# Patient Record
Sex: Male | Born: 1945 | Race: White | Hispanic: No | Marital: Married
Health system: Southern US, Community
[De-identification: ages and names within clinical notes are randomized; demographics above are authoritative.]

---

## 2013-10-24 ENCOUNTER — Other Ambulatory Visit: Payer: Self-pay | Admitting: Nurse Practitioner

## 2013-10-24 DIAGNOSIS — Z8249 Family history of ischemic heart disease and other diseases of the circulatory system: Secondary | ICD-10-CM

## 2013-10-24 DIAGNOSIS — R1084 Generalized abdominal pain: Secondary | ICD-10-CM

## 2013-10-24 DIAGNOSIS — R109 Unspecified abdominal pain: Secondary | ICD-10-CM

## 2013-11-04 ENCOUNTER — Ambulatory Visit
Admission: RE | Admit: 2013-11-04 | Discharge: 2013-11-04 | Disposition: A | Discharge status: Home / Self Care | Payer: Medicare Other | Source: Ambulatory Visit | Attending: Nurse Practitioner | Admitting: Nurse Practitioner

## 2013-11-04 DIAGNOSIS — Z8249 Family history of ischemic heart disease and other diseases of the circulatory system: Secondary | ICD-10-CM

## 2013-11-04 DIAGNOSIS — R1084 Generalized abdominal pain: Secondary | ICD-10-CM

## 2016-05-16 IMAGING — US US AORTA
1 series · 9 of 9 positions shown · non-contrast
Comparison: None.

CLINICAL DATA: 68-year-old male, with abdominal pain and a family
history of aortic aneurysm.

EXAM:
ULTRASOUND OF ABDOMINAL AORTA
TECHNIQUE: Ultrasound examination of the abdominal aorta was performed to
evaluate for abdominal aortic aneurysm.

[Series 1: us aorta · 0.27mm/px · 9 of 9 slices shown]
[im 1/9]
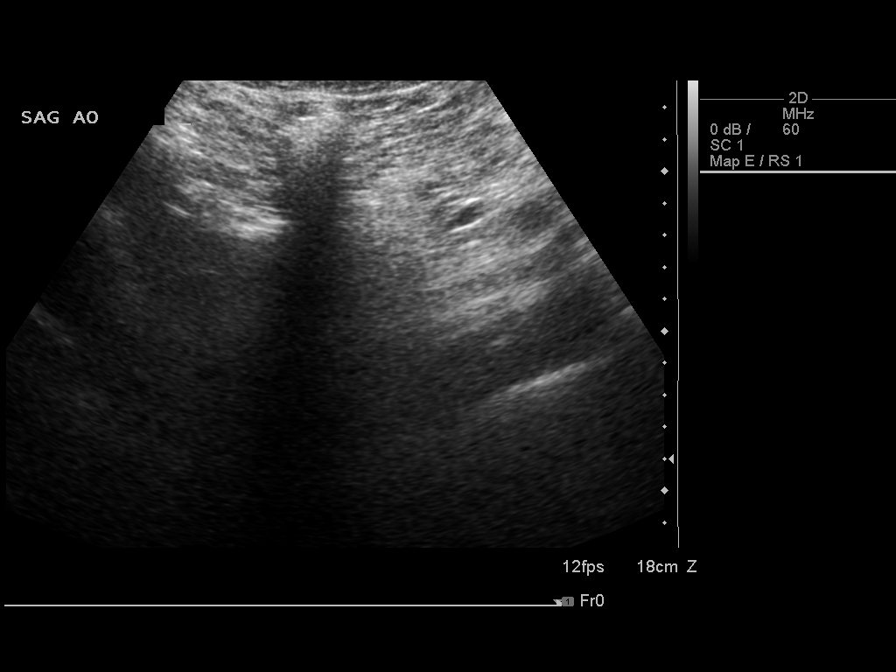
[im 2/9]
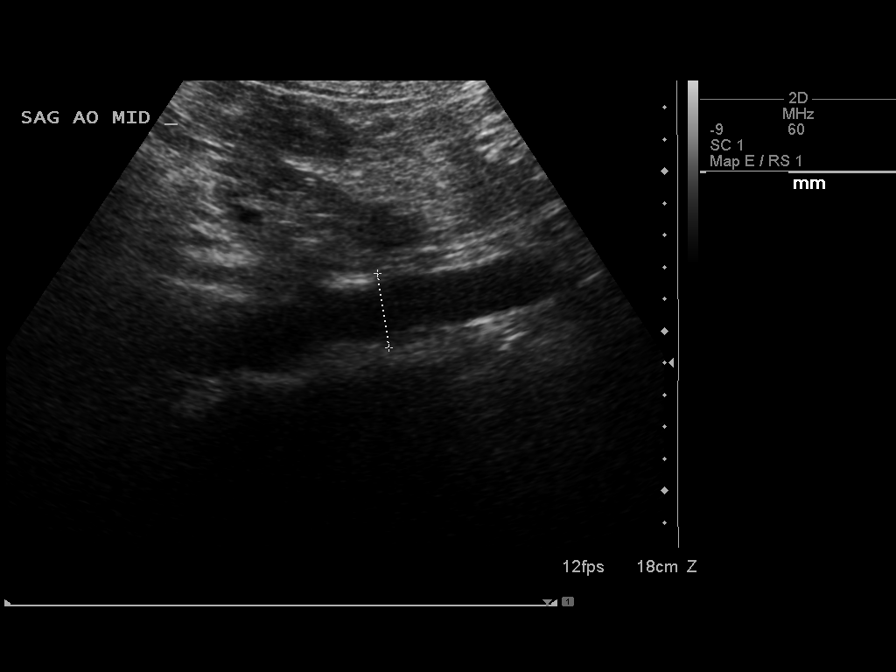
[im 3/9]
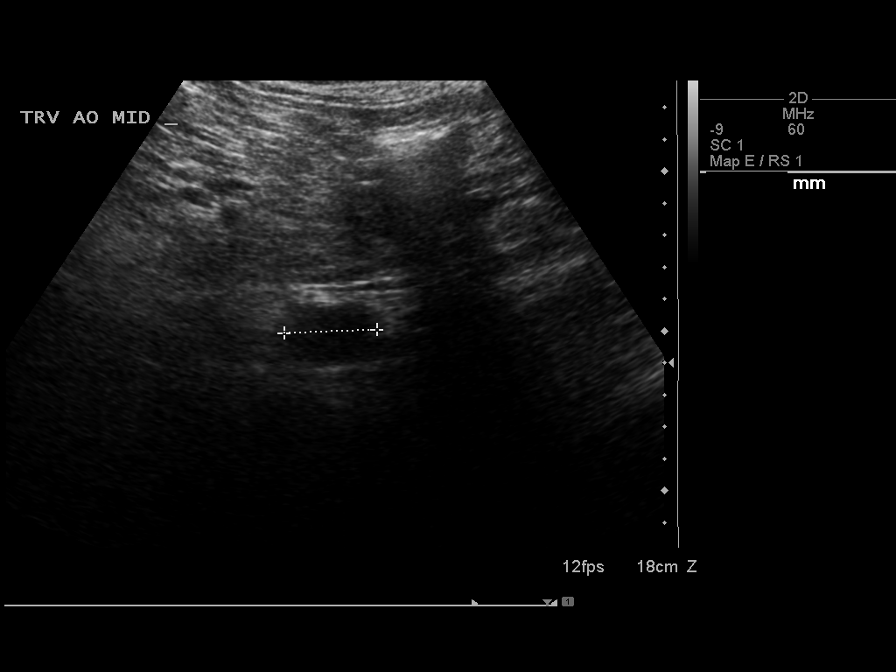
[im 4/9]
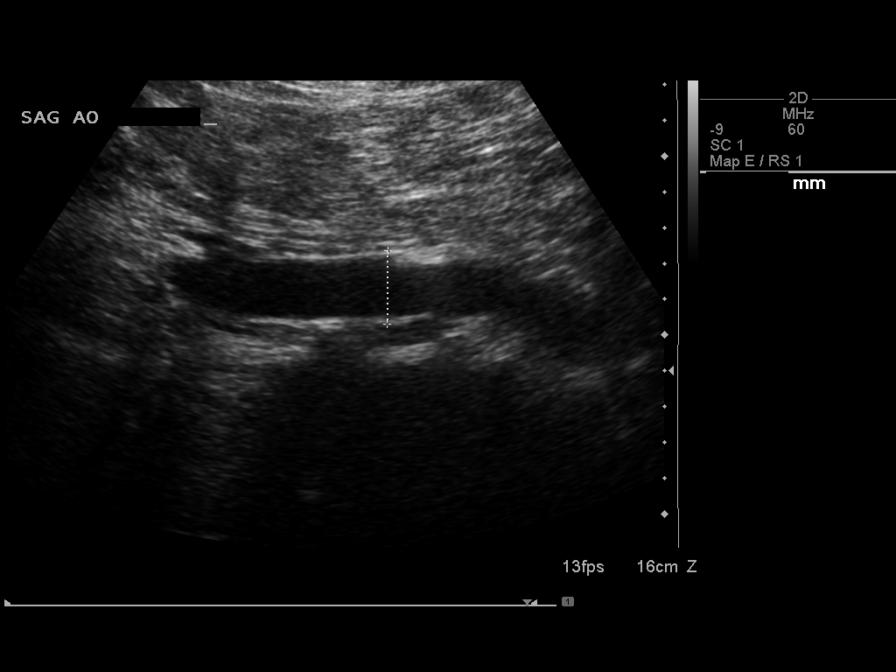
[im 5/9]
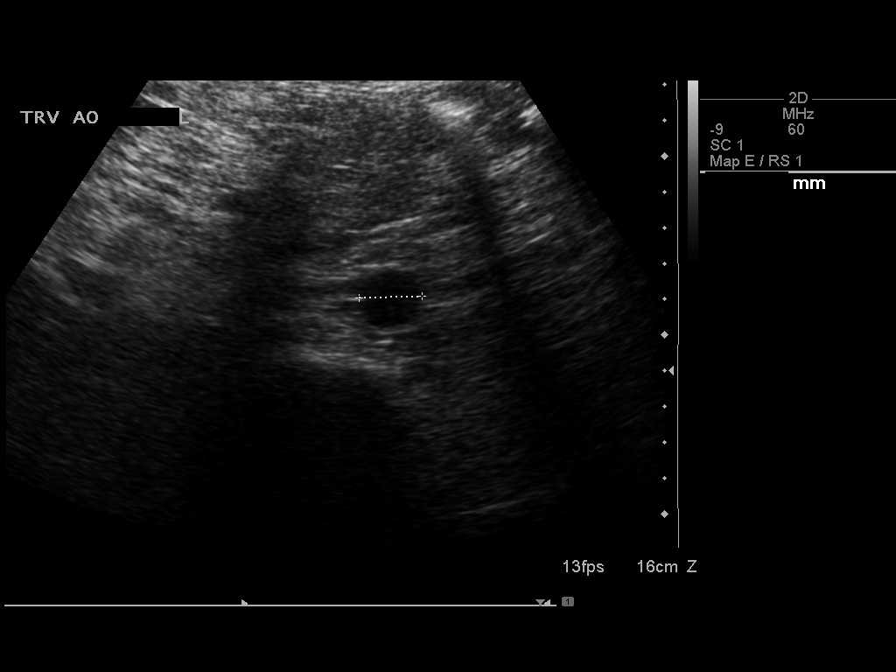
[im 6/9]
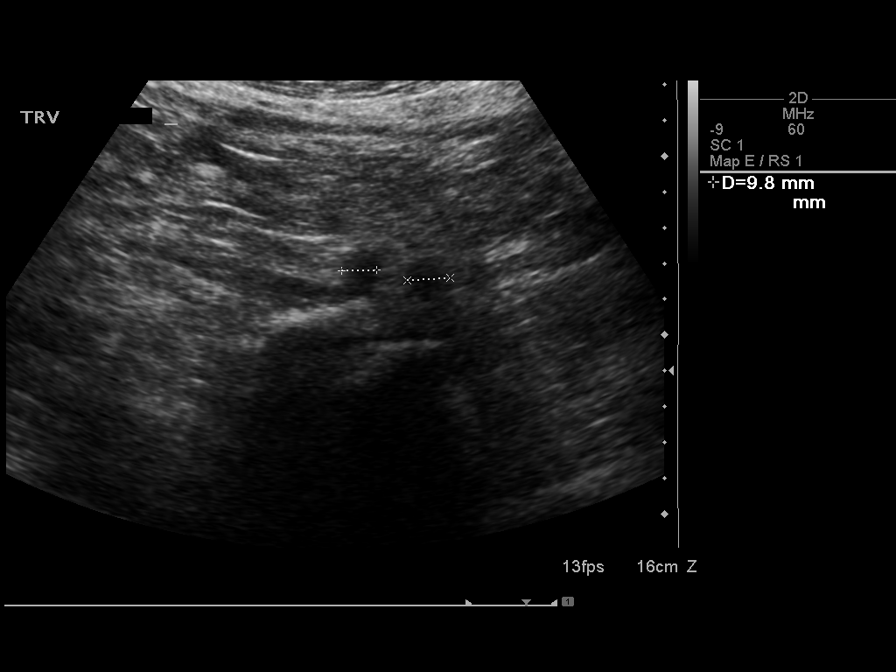
[im 7/9]
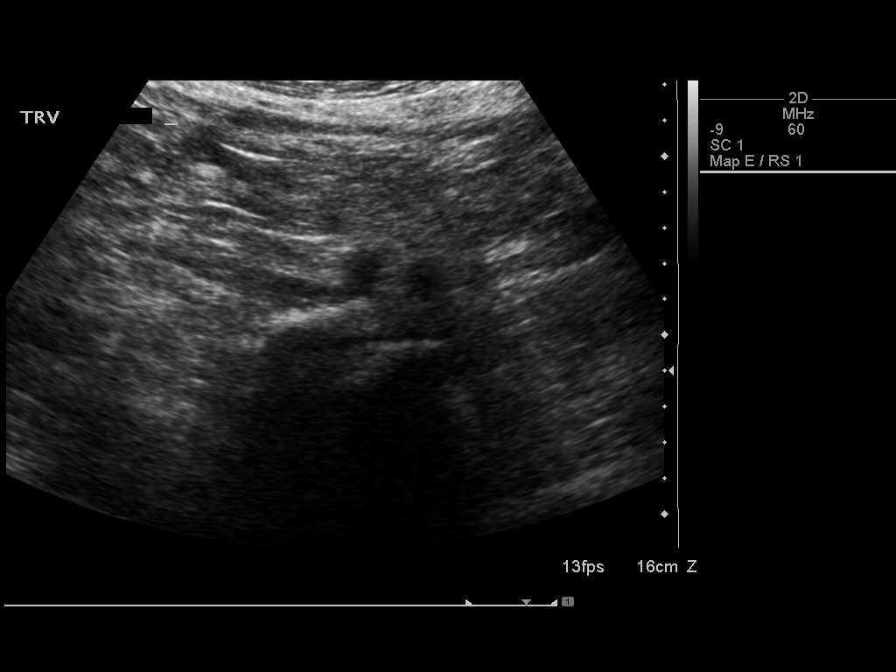
[im 8/9]
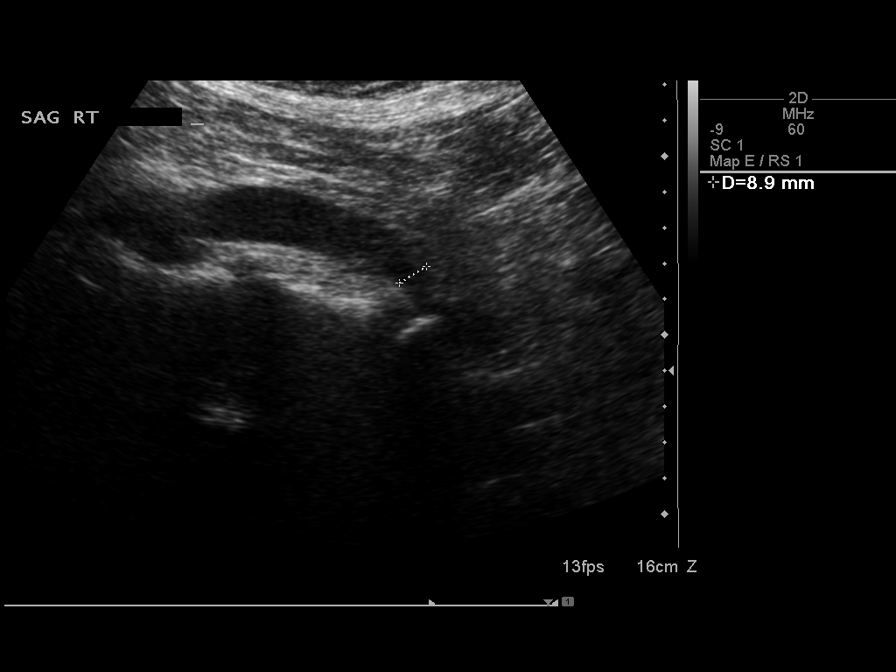
[im 9/9]
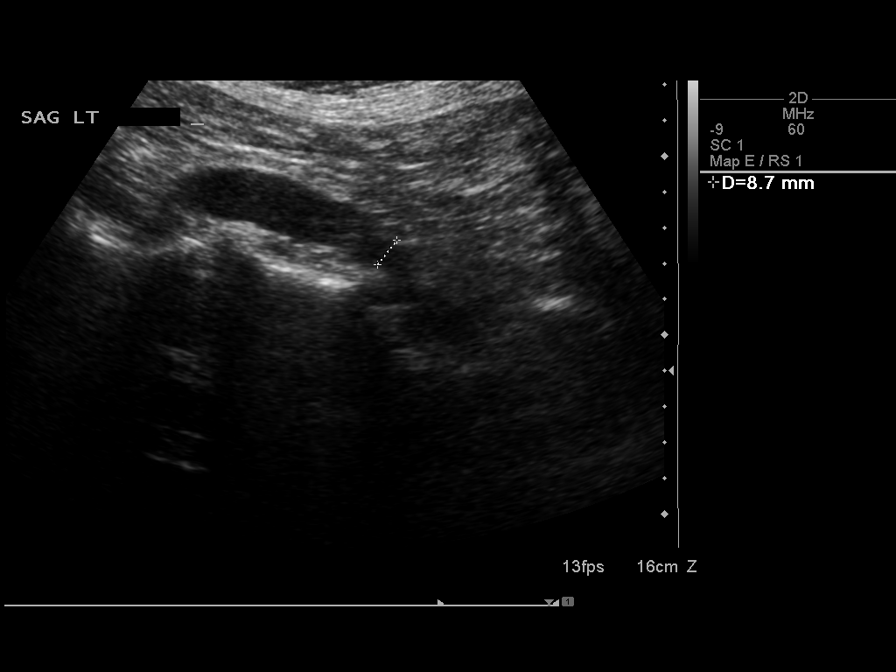

[9 of 9 positions shown; findings below may reference images not displayed]

FINDINGS: Abdominal Aorta

The proximal aorta at the lobe level of the aortic hiatus was not
visualized.

No aneurysmal dilation of the visualized mid and distal aorta.

Maximum AP

Diameter:  2.4 cm

Maximum TRV

Diameter: 2.9 cm

Greatest diameter of the right common iliac artery measures 9 mm.
Greatest diameter measured of the left common iliac artery measures
1.2 cm.
IMPRESSION: Ultrasound survey negative for aortic aneurysm.

Left common iliac artery is aneurysmal measuring 1.2 cm.
